# Patient Record
Sex: Male | Born: 1995 | Hispanic: Yes | Marital: Single | Smoking: Current some day smoker
Health system: Southern US, Community
[De-identification: ages and names within clinical notes are randomized; demographics above are authoritative.]

---

## 2015-07-11 ENCOUNTER — Observation Stay (HOSPITAL_COMMUNITY)
Admission: EM | Admit: 2015-07-11 | Discharge: 2015-07-12 | Disposition: A | Discharge status: Home / Self Care | Payer: Self-pay | Attending: Surgery | Admitting: Surgery

## 2015-07-11 ENCOUNTER — Observation Stay (HOSPITAL_COMMUNITY): Payer: Self-pay | Admitting: Anesthesiology

## 2015-07-11 ENCOUNTER — Encounter (HOSPITAL_COMMUNITY): Payer: Self-pay

## 2015-07-11 ENCOUNTER — Encounter (HOSPITAL_COMMUNITY)
Admission: EM | Disposition: A | Discharge status: Home / Self Care | Payer: Self-pay | Source: Home / Self Care | Attending: Emergency Medicine

## 2015-07-11 ENCOUNTER — Emergency Department (HOSPITAL_COMMUNITY): Payer: Self-pay

## 2015-07-11 DIAGNOSIS — F17210 Nicotine dependence, cigarettes, uncomplicated: Secondary | ICD-10-CM | POA: Insufficient documentation

## 2015-07-11 DIAGNOSIS — K3580 Unspecified acute appendicitis: Principal | ICD-10-CM | POA: Diagnosis present

## 2015-07-11 DIAGNOSIS — K353 Acute appendicitis with localized peritonitis, without perforation or gangrene: Secondary | ICD-10-CM | POA: Diagnosis present

## 2015-07-11 HISTORY — PX: LAPAROSCOPIC APPENDECTOMY: SHX408

## 2015-07-11 LAB — CBC
HCT: 44.9 % (ref 39.0–52.0)
Hemoglobin: 15.4 g/dL (ref 13.0–17.0)
MCH: 28.9 pg (ref 26.0–34.0)
MCHC: 34.3 g/dL (ref 30.0–36.0)
MCV: 84.4 fL (ref 78.0–100.0)
Platelets: 218 10*3/uL (ref 150–400)
RBC: 5.32 MIL/uL (ref 4.22–5.81)
RDW: 12.3 % (ref 11.5–15.5)
WBC: 8.3 10*3/uL (ref 4.0–10.5)

## 2015-07-11 LAB — URINE MICROSCOPIC-ADD ON
Bacteria, UA: NONE SEEN
Squamous Epithelial / LPF: NONE SEEN
WBC, UA: NONE SEEN WBC/hpf (ref 0–5)

## 2015-07-11 LAB — DIFFERENTIAL
BASOS ABS: 0 10*3/uL (ref 0.0–0.1)
BASOS PCT: 0 %
EOS ABS: 0.1 10*3/uL (ref 0.0–0.7)
EOS PCT: 1 %
LYMPHS ABS: 1.4 10*3/uL (ref 0.7–4.0)
Lymphocytes Relative: 18 %
MONOS PCT: 5 %
Monocytes Absolute: 0.4 10*3/uL (ref 0.1–1.0)
Neutro Abs: 5.9 10*3/uL (ref 1.7–7.7)
Neutrophils Relative %: 75 %

## 2015-07-11 LAB — COMPREHENSIVE METABOLIC PANEL
ALBUMIN: 4.5 g/dL (ref 3.5–5.0)
ALK PHOS: 57 U/L (ref 38–126)
ALT: 56 U/L (ref 17–63)
AST: 31 U/L (ref 15–41)
Anion gap: 7 (ref 5–15)
BILIRUBIN TOTAL: 0.8 mg/dL (ref 0.3–1.2)
BUN: 19 mg/dL (ref 6–20)
CALCIUM: 9 mg/dL (ref 8.9–10.3)
CO2: 27 mmol/L (ref 22–32)
Chloride: 103 mmol/L (ref 101–111)
Creatinine, Ser: 0.89 mg/dL (ref 0.61–1.24)
GFR calc Af Amer: >60 mL/min (ref >60)
GFR calc non Af Amer: >60 mL/min (ref >60)
GLUCOSE: 111 mg/dL — AB (ref 65–99)
Potassium: 3.8 mmol/L (ref 3.5–5.1)
Sodium: 137 mmol/L (ref 135–145)
TOTAL PROTEIN: 7.3 g/dL (ref 6.5–8.1)

## 2015-07-11 LAB — URINALYSIS, ROUTINE W REFLEX MICROSCOPIC
BILIRUBIN URINE: NEGATIVE
Glucose, UA: NEGATIVE mg/dL
HGB URINE DIPSTICK: NEGATIVE
KETONES UR: NEGATIVE mg/dL
Leukocytes, UA: NEGATIVE
NITRITE: NEGATIVE
PH: 7.5 (ref 5.0–8.0)
Specific Gravity, Urine: 1.015 (ref 1.005–1.030)

## 2015-07-11 LAB — LIPASE, BLOOD: Lipase: 17 U/L (ref 11–51)

## 2015-07-11 SURGERY — APPENDECTOMY, LAPAROSCOPIC
Anesthesia: General

## 2015-07-11 MED ORDER — DIATRIZOATE MEGLUMINE & SODIUM 66-10 % PO SOLN
ORAL | Status: AC
  Filled 2015-07-11: qty 30

## 2015-07-11 MED ORDER — BUPIVACAINE HCL (PF) 0.5 % IJ SOLN
INTRAMUSCULAR | Status: AC
Start: 2015-07-11 — End: 2015-07-11
  Filled 2015-07-11: qty 30

## 2015-07-11 MED ORDER — NEOSTIGMINE METHYLSULFATE 10 MG/10ML IV SOLN
INTRAVENOUS | Status: AC
  Filled 2015-07-11: qty 1

## 2015-07-11 MED ORDER — METRONIDAZOLE IN NACL 5-0.79 MG/ML-% IV SOLN: 500.0000 mg | INTRAVENOUS | Status: DC

## 2015-07-11 MED ORDER — NEOSTIGMINE METHYLSULFATE 10 MG/10ML IV SOLN
INTRAVENOUS | Status: DC | PRN
  Administered 2015-07-11: 2 mg via INTRAVENOUS
  Administered 2015-07-11: 1 mg via INTRAVENOUS

## 2015-07-11 MED ORDER — SUCCINYLCHOLINE CHLORIDE 20 MG/ML IJ SOLN
INTRAMUSCULAR | Status: AC
  Filled 2015-07-11: qty 1

## 2015-07-11 MED ORDER — CEFTRIAXONE SODIUM 2 G IJ SOLR
2.0000 g | Freq: Once | INTRAMUSCULAR | Status: AC
  Administered 2015-07-12: 2 g via INTRAVENOUS
  Filled 2015-07-11 (×2): qty 2

## 2015-07-11 MED ORDER — METRONIDAZOLE IN NACL 5-0.79 MG/ML-% IV SOLN
500.0000 mg | Freq: Once | INTRAVENOUS | Status: AC
  Administered 2015-07-11: 500 mg via INTRAVENOUS
  Filled 2015-07-11: qty 100

## 2015-07-11 MED ORDER — ONDANSETRON HCL 4 MG/2ML IJ SOLN
4.0000 mg | Freq: Once | INTRAMUSCULAR | Status: AC
  Administered 2015-07-11: 4 mg via INTRAVENOUS
  Filled 2015-07-11: qty 2

## 2015-07-11 MED ORDER — OXYCODONE-ACETAMINOPHEN 5-325 MG PO TABS
1.0000 | ORAL_TABLET | ORAL | Status: DC | PRN
Start: 2015-07-11 — End: 2015-07-12

## 2015-07-11 MED ORDER — FENTANYL CITRATE (PF) 100 MCG/2ML IJ SOLN
INTRAMUSCULAR | Status: DC | PRN
  Administered 2015-07-11: 50 ug via INTRAVENOUS
  Administered 2015-07-11: 100 ug via INTRAVENOUS
  Administered 2015-07-11 (×2): 50 ug via INTRAVENOUS
  Administered 2015-07-11: 100 ug via INTRAVENOUS

## 2015-07-11 MED ORDER — CEFTRIAXONE SODIUM 2 G IJ SOLR
2.0000 g | Freq: Once | INTRAMUSCULAR | Status: DC
  Filled 2015-07-11: qty 2

## 2015-07-11 MED ORDER — CEFTRIAXONE SODIUM 2 G IJ SOLR
2.0000 g | INTRAMUSCULAR | Status: DC
  Filled 2015-07-11: qty 2

## 2015-07-11 MED ORDER — MORPHINE SULFATE (PF) 4 MG/ML IV SOLN
4.0000 mg | Freq: Once | INTRAVENOUS | Status: AC
  Administered 2015-07-11: 4 mg via INTRAVENOUS
  Filled 2015-07-11: qty 1

## 2015-07-11 MED ORDER — METRONIDAZOLE IN NACL 5-0.79 MG/ML-% IV SOLN: 500.0000 mg | Freq: Three times a day (TID) | INTRAVENOUS | Status: DC

## 2015-07-11 MED ORDER — CHLORHEXIDINE GLUCONATE CLOTH 2 % EX PADS: 6.0000 | MEDICATED_PAD | Freq: Once | CUTANEOUS | Status: DC

## 2015-07-11 MED ORDER — ONDANSETRON HCL 4 MG/2ML IJ SOLN
INTRAMUSCULAR | Status: DC | PRN
  Administered 2015-07-11: 4 mg via INTRAVENOUS

## 2015-07-11 MED ORDER — FENTANYL CITRATE (PF) 250 MCG/5ML IJ SOLN
INTRAMUSCULAR | Status: AC
Start: 2015-07-11 — End: 2015-07-11
  Filled 2015-07-11: qty 5

## 2015-07-11 MED ORDER — METRONIDAZOLE IN NACL 5-0.79 MG/ML-% IV SOLN
500.0000 mg | Freq: Three times a day (TID) | INTRAVENOUS | Status: AC
  Administered 2015-07-11 – 2015-07-12 (×2): 500 mg via INTRAVENOUS
  Filled 2015-07-11 (×2): qty 100

## 2015-07-11 MED ORDER — LIDOCAINE HCL (PF) 1 % IJ SOLN
INTRAMUSCULAR | Status: AC
  Filled 2015-07-11: qty 5

## 2015-07-11 MED ORDER — SODIUM CHLORIDE 0.9 % IR SOLN
Status: DC | PRN
  Administered 2015-07-11: 1000 mL

## 2015-07-11 MED ORDER — PROPOFOL 10 MG/ML IV BOLUS
INTRAVENOUS | Status: DC | PRN
  Administered 2015-07-11: 180 mg via INTRAVENOUS

## 2015-07-11 MED ORDER — DEXTROSE 5 % IV SOLN
1.0000 g | Freq: Once | INTRAVENOUS | Status: AC
  Administered 2015-07-11: 1 g via INTRAVENOUS
  Filled 2015-07-11: qty 10

## 2015-07-11 MED ORDER — ONDANSETRON HCL 4 MG/2ML IJ SOLN
INTRAMUSCULAR | Status: AC
  Filled 2015-07-11: qty 2

## 2015-07-11 MED ORDER — MIDAZOLAM HCL 5 MG/5ML IJ SOLN
INTRAMUSCULAR | Status: DC | PRN
  Administered 2015-07-11 (×2): 2 mg via INTRAVENOUS

## 2015-07-11 MED ORDER — FENTANYL CITRATE (PF) 100 MCG/2ML IJ SOLN
INTRAMUSCULAR | Status: AC
  Filled 2015-07-11: qty 2

## 2015-07-11 MED ORDER — SUCCINYLCHOLINE CHLORIDE 20 MG/ML IJ SOLN
INTRAMUSCULAR | Status: DC | PRN
  Administered 2015-07-11: 175 mg via INTRAVENOUS

## 2015-07-11 MED ORDER — ROCURONIUM BROMIDE 50 MG/5ML IV SOLN
INTRAVENOUS | Status: AC
  Filled 2015-07-11: qty 1

## 2015-07-11 MED ORDER — GLYCOPYRROLATE 0.2 MG/ML IJ SOLN
INTRAMUSCULAR | Status: DC | PRN
  Administered 2015-07-11: 0.6 mg via INTRAVENOUS

## 2015-07-11 MED ORDER — METOCLOPRAMIDE HCL 5 MG/ML IJ SOLN
INTRAMUSCULAR | Status: AC
Start: 2015-07-11 — End: 2015-07-11
  Filled 2015-07-11: qty 2

## 2015-07-11 MED ORDER — LIDOCAINE HCL (PF) 1 % IJ SOLN
INTRAMUSCULAR | Status: AC
  Filled 2015-07-11: qty 30

## 2015-07-11 MED ORDER — ROCURONIUM BROMIDE 100 MG/10ML IV SOLN
INTRAVENOUS | Status: DC | PRN
  Administered 2015-07-11: 20 mg via INTRAVENOUS
  Administered 2015-07-11: 35 mg via INTRAVENOUS
  Administered 2015-07-11: 5 mg via INTRAVENOUS

## 2015-07-11 MED ORDER — LIDOCAINE HCL 1 % IJ SOLN
INTRAMUSCULAR | Status: DC | PRN
  Administered 2015-07-11: 35 mg via INTRADERMAL

## 2015-07-11 MED ORDER — METOCLOPRAMIDE HCL 5 MG/ML IJ SOLN
INTRAMUSCULAR | Status: DC | PRN
  Administered 2015-07-11: 10 mg via INTRAVENOUS

## 2015-07-11 MED ORDER — DEXAMETHASONE SODIUM PHOSPHATE 10 MG/ML IJ SOLN
INTRAMUSCULAR | Status: DC | PRN
  Administered 2015-07-11: 10 mg via INTRAVENOUS

## 2015-07-11 MED ORDER — IBUPROFEN 600 MG PO TABS: 600.0000 mg | ORAL_TABLET | Freq: Four times a day (QID) | ORAL | Status: DC | PRN

## 2015-07-11 MED ORDER — PROPOFOL 10 MG/ML IV BOLUS
INTRAVENOUS | Status: AC
  Filled 2015-07-11: qty 20

## 2015-07-11 MED ORDER — LACTATED RINGERS IV SOLN
INTRAVENOUS | Status: DC | PRN
  Administered 2015-07-11 (×2): via INTRAVENOUS

## 2015-07-11 MED ORDER — IOPAMIDOL (ISOVUE-300) INJECTION 61%
100.0000 mL | Freq: Once | INTRAVENOUS | Status: AC | PRN
  Administered 2015-07-11: 100 mL via INTRAVENOUS

## 2015-07-11 MED ORDER — MIDAZOLAM HCL 2 MG/2ML IJ SOLN
INTRAMUSCULAR | Status: AC
  Filled 2015-07-11: qty 4

## 2015-07-11 MED ORDER — DEXAMETHASONE SODIUM PHOSPHATE 4 MG/ML IJ SOLN
INTRAMUSCULAR | Status: AC
  Filled 2015-07-11: qty 1

## 2015-07-11 MED ORDER — GLYCOPYRROLATE 0.2 MG/ML IJ SOLN
INTRAMUSCULAR | Status: AC
  Filled 2015-07-11: qty 3

## 2015-07-11 MED ORDER — LIDOCAINE HCL 1 % IJ SOLN
INTRAMUSCULAR | Status: DC | PRN
  Administered 2015-07-11: 12 mL via INTRAMUSCULAR
  Administered 2015-07-11 (×2): 3 mL via INTRAMUSCULAR

## 2015-07-11 SURGICAL SUPPLY — 40 items
BAG HAMPER (MISCELLANEOUS) ×3 IMPLANT
CHLORAPREP W/TINT 26ML (MISCELLANEOUS) ×3 IMPLANT
CLOTH BEACON ORANGE TIMEOUT ST (SAFETY) ×3 IMPLANT
COVER LIGHT HANDLE STERIS (MISCELLANEOUS) ×6 IMPLANT
CUTTER LINEAR ENDO 35 ART FLEX (STAPLE) ×3 IMPLANT
DECANTER SPIKE VIAL GLASS SM (MISCELLANEOUS) ×3 IMPLANT
DERMABOND ADVANCED (GAUZE/BANDAGES/DRESSINGS) ×2
DERMABOND ADVANCED .7 DNX12 (GAUZE/BANDAGES/DRESSINGS) ×1 IMPLANT
DEVICE TROCAR PUNCTURE CLOSURE (ENDOMECHANICALS) ×3 IMPLANT
ELECT REM PT RETURN 9FT ADLT (ELECTROSURGICAL) ×3
ELECTRODE REM PT RTRN 9FT ADLT (ELECTROSURGICAL) ×1 IMPLANT
EVACUATOR SMOKE 8.L (FILTER) ×3 IMPLANT
FORMALIN 10 PREFIL 120ML (MISCELLANEOUS) ×3 IMPLANT
GLOVE BIOGEL PI IND STRL 7.0 (GLOVE) ×3 IMPLANT
GLOVE BIOGEL PI IND STRL 7.5 (GLOVE) ×1 IMPLANT
GLOVE BIOGEL PI INDICATOR 7.0 (GLOVE) ×6
GLOVE BIOGEL PI INDICATOR 7.5 (GLOVE) ×2
GLOVE ECLIPSE 6.5 STRL STRAW (GLOVE) ×3 IMPLANT
GLOVE ECLIPSE 7.0 STRL STRAW (GLOVE) ×3 IMPLANT
GOWN STRL REUS W/ TWL XL LVL3 (GOWN DISPOSABLE) ×1 IMPLANT
GOWN STRL REUS W/TWL LRG LVL3 (GOWN DISPOSABLE) ×3 IMPLANT
GOWN STRL REUS W/TWL XL LVL3 (GOWN DISPOSABLE) ×2
INST SET LAPROSCOPIC AP (KITS) ×3 IMPLANT
KIT ROOM TURNOVER APOR (KITS) ×3 IMPLANT
MANIFOLD NEPTUNE II (INSTRUMENTS) ×3 IMPLANT
NEEDLE INSUFFLATION 14GA 120MM (NEEDLE) ×3 IMPLANT
NS IRRIG 1000ML POUR BTL (IV SOLUTION) ×3 IMPLANT
PACK LAP CHOLE LZT030E (CUSTOM PROCEDURE TRAY) ×3 IMPLANT
PAD ARMBOARD 7.5X6 YLW CONV (MISCELLANEOUS) ×3 IMPLANT
POUCH SPECIMEN RETRIEVAL 10MM (ENDOMECHANICALS) ×3 IMPLANT
SET BASIN LINEN APH (SET/KITS/TRAYS/PACK) ×3 IMPLANT
SHEARS HARMONIC ACE PLUS 36CM (ENDOMECHANICALS) ×3 IMPLANT
SUT VIC AB 4-0 PS2 27 (SUTURE) ×3 IMPLANT
SUT VICRYL 0 UR6 27IN ABS (SUTURE) ×3 IMPLANT
TRAY FOLEY CATH SILVER 16FR (SET/KITS/TRAYS/PACK) ×3 IMPLANT
TROCAR ENDO BLADELESS 11MM (ENDOMECHANICALS) ×3 IMPLANT
TROCAR ENDO BLADELESS 12MM (ENDOMECHANICALS) ×3 IMPLANT
TROCAR XCEL NON-BLD 5MMX100MML (ENDOMECHANICALS) ×3 IMPLANT
TUBING INSUFFLATION (TUBING) ×3 IMPLANT
WARMER LAPAROSCOPE (MISCELLANEOUS) ×3 IMPLANT

## 2015-07-11 NOTE — Anesthesia Procedure Notes (Signed)
Procedure Name: Intubation Date/Time: 07/11/2015 4:43 PM Performed by: Despina HiddenIDACAVAGE, Atwood Adcock J Pre-anesthesia Checklist: Patient identified, Emergency Drugs available, Suction available and Patient being monitored Patient Re-evaluated:Patient Re-evaluated prior to inductionOxygen Delivery Method: Circle system utilized Preoxygenation: Pre-oxygenation with 100% oxygen Intubation Type: IV induction, Rapid sequence and Cricoid Pressure applied Ventilation: Mask ventilation without difficulty Laryngoscope Size: Mac and 3 Grade View: Grade I Tube size: 8.0 mm Number of attempts: 1 Airway Equipment and Method: Stylet and Oral airway Placement Confirmation: ETT inserted through vocal cords under direct vision and positive ETCO2 Secured at: 22 cm Tube secured with: Tape Dental Injury: Teeth and Oropharynx as per pre-operative assessment

## 2015-07-11 NOTE — ED Notes (Signed)
Per surgeon, pt is to remain in ED until time of surgery at approximately 1600 and then pt will be transferred to inpatient room.

## 2015-07-11 NOTE — ED Provider Notes (Signed)
CSN: [id number]     Arrival date & time 07/11/15  7846 History   First MD Initiated Contact with Patient 07/11/15 [phone]     Chief Complaint  Patient presents with  . Abdominal Pain     (Consider location/radiation/quality/duration/timing/severity/associated sxs/prior Treatment) HPI   Marc Todd is a 20 y.o. male who presents to the Emergency Department complaining of Sudden onset of upper abdominal pain that began at 6:30 AM. He describes the pain as sharp, burning and constant. It is also associated with right lower quadrant pain as well.  He reports eating spicy chicken wings last night prior to going to bed. He denies pain similar to this in the past. He also denies heavy alcohol use, fever, dysuria, CP, shortness of breath, vomiting or diarrhea. Nothing makes the pain better or worse. No current medications or previous abdominal surgeries.  Last meal was 10:30 last night   History reviewed. No pertinent past medical history. History reviewed. No pertinent past surgical history. History reviewed. No pertinent family history. Social History  Substance Use Topics  . Smoking status: Current Some Day Smoker    Types: Cigarettes  . Smokeless tobacco: None  . Alcohol Use: Yes     Comment: occasional    Review of Systems  Constitutional: Negative for fever, chills and appetite change.  HENT: Negative for sore throat.   Respiratory: Negative for shortness of breath.   Cardiovascular: Negative for chest pain.  Gastrointestinal: Positive for abdominal pain. Negative for nausea, vomiting, blood in stool and abdominal distention.  Genitourinary: Negative for dysuria, flank pain, decreased urine volume and difficulty urinating.  Musculoskeletal: Negative for back pain.  Skin: Negative for color change and rash.  Neurological: Negative for dizziness, weakness and numbness.  Hematological: Negative for adenopathy.  All other systems reviewed and are negative.     Allergies  Review  of patient's allergies indicates not on file.  Home Medications   Prior to Admission medications   Not on File   BP 147/92 mmHg  Pulse 78  Temp(Src) 97.8 F (36.6 C) (Oral)  Resp 16  Ht  (1.88 m)  Wt 108.863 kg  BMI 30.80 kg/m2  SpO2 100% Physical Exam  Constitutional: He is oriented to person, place, and time. He appears well-developed and well-nourished. No distress.  HENT:  Head: Normocephalic and atraumatic.  Mouth/Throat: Oropharynx is clear and moist.  Neck: Normal range of motion. Neck supple.  Cardiovascular: Normal rate, regular rhythm, normal heart sounds and intact distal pulses.   No murmur heard. Pulmonary/Chest: Effort normal and breath sounds normal. No respiratory distress.  Abdominal: Soft. Normal appearance and bowel sounds are normal. He exhibits no distension and no mass. There is tenderness in the right lower quadrant and epigastric area. There is no rigidity, no rebound, no guarding and no CVA tenderness.  Tenderness to the epigastric and right lower quadrant, no guarding or rebound tenderness.  Musculoskeletal: Normal range of motion. He exhibits no edema.  Neurological: He is alert and oriented to person, place, and time. He exhibits normal muscle tone. Coordination normal.  Skin: Skin is warm and dry.  Psychiatric: He has a normal mood and affect.  Nursing note and vitals reviewed.   ED Course  Procedures (including critical care time) Labs Review Labs Reviewed  COMPREHENSIVE METABOLIC PANEL - Abnormal; Notable for the following:    Glucose, Bld 111 (*)    All other components within normal limits  URINALYSIS, ROUTINE W REFLEX MICROSCOPIC (NOT AT [facility]) - Abnormal;  Notable for the following:    Protein, ur TRACE (*)    All other components within normal limits  LIPASE, BLOOD  CBC  DIFFERENTIAL  URINE MICROSCOPIC-ADD ON    Imaging Review Ct Abdomen Pelvis W Contrast  07/11/2015  CLINICAL DATA:  Awoke with constant upper abdominal pain,  points epigastric region, RIGHT lower quadrant pain, describes it as burning, no vomiting or diarrhea, smoker, initial encounter EXAM: CT ABDOMEN AND PELVIS WITH CONTRAST TECHNIQUE: Multidetector CT imaging of the abdomen and pelvis was performed using the standard protocol following bolus administration of intravenous contrast. Sagittal and coronal MPR images reconstructed from axial data set. CONTRAST:  100mL ISOVUE-300 IOPAMIDOL (ISOVUE-300) INJECTION 61% IV. Dilute oral contrast. COMPARISON:  None FINDINGS: Lower chest:  Lung bases clear Hepatobiliary: Liver and gallbladder normal appearance Pancreas: Normal appearance Spleen: Normal appearance.  Splenule at splenic hilum. Adrenals/Urinary Tract: Adrenal glands normal appearance. Kidneys normal appearance without mass or hydronephrosis. Normal appearing ureters in urinary bladder. Stomach/Bowel: Terminal ileum and cecum normal appearance. Enlarged thickened appendix with minimal periappendiceal inflammatory changes consistent with acute appendicitis. Stomach and remaining bowel loops normal appearance. Vascular/Lymphatic: Vascular structures patent.  No adenopathy. Reproductive: N/A Other: No free air or free fluid.  No hernia or mass. Musculoskeletal: Few scattered Schmorl's nodes and irregular vertebral endplates. No acute bony findings. IMPRESSION: Enlarged thickened appendix with minimal periappendiceal inflammatory changes compatible with acute appendicitis. Electronically Signed   By: Ulyses SouthwardMark  Boles M.D.   On: 07/11/2015 11:25   I have personally reviewed and evaluated these images and lab results as part of my medical decision-making.   EKG Interpretation None      MDM   Final diagnoses:  Acute appendicitis without peritonitis   1145  Patient resting comfortably, pain has improved after medications.  Has remained NPO.  Discussed CT findings with the patient.  Labs wnml, afebrile.  Will consult general surgery  1150  Consulted Dr. Earlene Plateravis, will  see pt in ED, recommends Rocephin and Flagyl   Pauline Ausammy Tarena Gockley, PA-C 07/11/15 1204  Loren Raceravid Yelverton, MD 07/11/15 [phone]

## 2015-07-11 NOTE — ED Notes (Signed)
Pt given CHG wipes to cleanse body with.

## 2015-07-11 NOTE — Transfer of Care (Signed)
Immediate Anesthesia Transfer of Care Note  Patient: Marc Todd  Procedure(s) Performed: Procedure(s): LAPAROSCOPIC APPENDECTOMY (N/A)  Patient Location: PACU  Anesthesia Type:General  Level of Consciousness: awake and patient cooperative  Airway & Oxygen Therapy: Patient Spontanous Breathing and Patient connected to face mask oxygen  Post-op Assessment: Report given to RN, Post -op Vital signs reviewed and stable and Patient moving all extremities  Post vital signs: Reviewed and stable  Last Vitals:  Filed Vitals:   07/11/15 1545 07/11/15 1600  BP: 117/73 118/61  Pulse: 70   Temp: 36.7 C   Resp: 24 19    Last Pain:  Filed Vitals:   07/11/15 1609  PainSc: 8          Complications: No apparent anesthesia complications

## 2015-07-11 NOTE — H&P (Signed)
SURGICAL ADMISSION HISTORY & PHYSICAL  HISTORY OF PRESENT ILLNESS (HPI):  20 y.o. male, otherwise reportedly healthy, presented with acute onset of epigastric abdominal pain that woke him from sleep this morning and then progressed to become greatest at the Right lower quadrant. Patient denies any nausea, vomiting, change in bowel habits, fever/chills, CP, or SOB and reports his pain is now currently well-controlled.   PAST MEDICAL HISTORY (PMH):  History reviewed. No pertinent past medical history.   PAST SURGICAL HISTORY (PSH):  History reviewed. No pertinent past surgical history.   MEDICATIONS:  Prior to Admission medications   Not on File    ALLERGIES:  Not on File   SOCIAL HISTORY:  Social History   Social History  . Marital Status: Single    Spouse Name: N/A  . Number of Children: N/A  . Years of Education: N/A   Occupational History  . Not on file.   Social History Main Topics  . Smoking status: Current Some Day Smoker    Types: Cigarettes  . Smokeless tobacco: Not on file  . Alcohol Use: Yes     Comment: occasional  . Drug Use: No  . Sexual Activity: Not on file   Other Topics Concern  . Not on file   Social History Narrative  . No narrative on file    The patient currently resides (home / rehab facility / nursing home): Home  The patient normally is (ambulatory / bedbound): Ambulatory   FAMILY HISTORY:  History reviewed. No pertinent family history.   REVIEW OF SYSTEMS:  Constitutional: denies weight loss, fever, chills, or sweats  Eyes: denies any other vision changes, history of eye injury  ENT: denies sore throat, hearing problems  Respiratory: denies shortness of breath, wheezing  Cardiovascular: denies chest pain, palpitations  Gastrointestinal: abdominal pain as per HPI, denies N/V or diarrhea  Musculoskeletal: denies  any other joint pains or cramps  Skin: denies any other rashes or skin discolorations  Neurological: denies any other headache, dizziness, weakness  Psychiatric: denies any other depression, anxiety   All other review of systems were negative   VITAL SIGNS:  Temp: [97.8 F (36.6 C)-98 F (36.7 C)] 98 F (36.7 C) (06/21 1122) Pulse Rate: [66-78] 66 (06/21 1200) Resp: [16] 16 (06/21 1122) BP: (121-147)/(70-92) 134/86 mmHg (06/21 1200) SpO2: [97 %-100 %] 99 % (06/21 1200) Weight: [108.863 kg (240 lb)] 108.863 kg (240 lb) (06/21 0851)     Height:  (188 cm) Weight: 108.863 kg (240 lb) BMI (Calculated): 30.9   INTAKE/OUTPUT:  This shift:    Last 2 shifts: @   PHYSICAL EXAM:  Constitutional:  -- Normal body habitus (mildly overweight)  -- Awake, alert, and oriented x3  Eyes:  -- Pupils equally round and reactive to light  -- No scleral icterus  Ear, nose, and throat:  -- No jugular venous distension  Pulmonary:  -- No crackles  -- Equal breath sounds bilaterally  Cardiovascular:  -- S1, S2 present  -- No pericardial rubs Abdomen:  -- Soft, mild RLQ tenderness to palpation, nondistended, no guarding/rebound  -- No abdominal masses appreciated, pulsatile or otherwise  Musculoskeletal / Integumentary:  -- Wounds or skin discoloration: None  -- Extremities: B/L UE and LE FROM, hands and feet warm, no edema  Neurologic:  -- Motor function: intact and symmetric -- Sensation: intact and symmetric  Labs:  CBC:   Labs (Brief)    Lab Results  Component Value Date   WBC 8.3 07/11/2015  RBC 5.32 07/11/2015     BMP:   Labs (Brief)    Lab Results  Component Value Date   GLUCOSE 111* 07/11/2015   CO2 27 07/11/2015   BUN 19 07/11/2015   CREATININE 0.89 07/11/2015   CALCIUM 9.0 07/11/2015       Imaging studies:  CT abdomen and pelvis (07/11/2015) Terminal ileum and cecum normal  appearance. Enlarged thickened appendix with minimal periappendiceal inflammatory changes consistent with acute appendicitis. Stomach and remaining bowel loops normal appearance.  Assessment/Plan:  20 y.o. male with acute appendicitis.  - NPO, IV fluids - IV Rocephin and Flagyl  - IV pain control as needed  - All risks, benefits, and alternatives to appendectomy were discussed with the patient, who elects to proceed, and informed consent was obtained and documented accordingly - DVT prophylaxis  All of the above findings and recommendations were discussed with the patient and his family, and all of his and family's questions were answered to their expressed satisfaction.  Thank you for the opportunity to participate in the care for this patient.   -- Scherrie GerlachJason E. Earlene Plateravis, MD [place]: [facility] Office: [phone]

## 2015-07-11 NOTE — ED Notes (Signed)
Pt reports he woke up with constant upper abdominal pain, pt points to epigastric area. Describes as burning. No vomiting/ diarrhea

## 2015-07-11 NOTE — Anesthesia Preprocedure Evaluation (Addendum)
Anesthesia Evaluation  Patient identified by MRN, date of birth, ID band Patient awake    Reviewed: Allergy & Precautions, NPO status , Patient's Chart, lab work & pertinent test results  History of Anesthesia Complications Negative for: history of anesthetic complications  Airway Mallampati: II  TM Distance: >3 FB Neck ROM: Full    Dental  (+) Teeth Intact, Dental Advisory Given   Pulmonary Current Smoker,    breath sounds clear to auscultation       Cardiovascular Exercise Tolerance: Good  Rhythm:Regular Rate:Normal     Neuro/Psych    GI/Hepatic Patient received Oral Contrast Agents,  Endo/Other    Renal/GU      Musculoskeletal   Abdominal (+)  Abdomen: soft. Bowel sounds: normal.  Peds  Hematology   Anesthesia Other Findings   Reproductive/Obstetrics                           Anesthesia Physical Anesthesia Plan  ASA: I and emergent  Anesthesia Plan: General   Post-op Pain Management:    Induction: Intravenous, Cricoid pressure planned and Rapid sequence  Airway Management Planned: Oral ETT  Additional Equipment:   Intra-op Plan:   Post-operative Plan: Extubation in OR  Informed Consent: I have reviewed the patients History and Physical, chart, labs and discussed the procedure including the risks, benefits and alternatives for the proposed anesthesia with the patient or authorized representative who has indicated his/her understanding and acceptance.   Dental advisory given  Plan Discussed with: CRNA and Anesthesiologist  Anesthesia Plan Comments:         Anesthesia Quick Evaluation

## 2015-07-11 NOTE — Consult Note (Signed)
SURGICAL CONSULTATION NOTE (initial)  HISTORY OF PRESENT ILLNESS (HPI):  20 y.o. male, otherwise reportedly healthy, presented with acute onset of epigastric abdominal pain that woke him from sleep this morning and then progressed to become greatest at the Right lower quadrant. Patient denies any nausea, vomiting, change in bowel habits, fever/chills, CP, or SOB and reports his pain is now currently well-controlled.   PAST MEDICAL HISTORY (PMH):  History reviewed. No pertinent past medical history.   PAST SURGICAL HISTORY (PSH):  History reviewed. No pertinent past surgical history.   MEDICATIONS:  Prior to Admission medications   Not on File     ALLERGIES:  Not on File   SOCIAL HISTORY:  Social History   Social History  . Marital Status: Single    Spouse Name: N/A  . Number of Children: N/A  . Years of Education: N/A   Occupational History  . Not on file.   Social History Main Topics  . Smoking status: Current Some Day Smoker    Types: Cigarettes  . Smokeless tobacco: Not on file  . Alcohol Use: Yes     Comment: occasional  . Drug Use: No  . Sexual Activity: Not on file   Other Topics Concern  . Not on file   Social History Narrative  . No narrative on file    The patient currently resides (home / rehab facility / nursing home): Home  The patient normally is (ambulatory / bedbound): Ambulatory   FAMILY HISTORY:  History reviewed. No pertinent family history.   REVIEW OF SYSTEMS:  Constitutional: denies weight loss, fever, chills, or sweats  Eyes: denies any other vision changes, history of eye injury  ENT: denies sore throat, hearing problems  Respiratory: denies shortness of breath, wheezing  Cardiovascular: denies chest pain, palpitations  Gastrointestinal: abdominal pain as per HPI, denies N/V or diarrhea  Musculoskeletal: denies any other joint pains or cramps  Skin: denies any other rashes or skin discolorations  Neurological: denies any other  headache, dizziness, weakness  Psychiatric: denies any other depression, anxiety   All other review of systems were negative   VITAL SIGNS:  Temp:  [97.8 F (36.6 C)-98 F (36.7 C)] 98 F (36.7 C) (06/21 1122) Pulse Rate:  [66-78] 66 (06/21 1200) Resp:  [16] 16 (06/21 1122) BP: (121-147)/(70-92) 134/86 mmHg (06/21 1200) SpO2:  [97 %-100 %] 99 % (06/21 1200) Weight:  [108.863 kg (240 lb)] 108.863 kg (240 lb) (06/21 0851)     Height: 6\' 2"  (188 cm) Weight: 108.863 kg (240 lb) BMI (Calculated): 30.9   INTAKE/OUTPUT:  This shift:    Last 2 shifts: @IOLAST2SHIFTS @   PHYSICAL EXAM:  Constitutional:  -- Normal body habitus (mildly overweight)  -- Awake, alert, and oriented x3  Eyes:  -- Pupils equally round and reactive to light  -- No scleral icterus  Ear, nose, and throat:  -- No jugular venous distension  Pulmonary:  -- No crackles  -- Equal breath sounds bilaterally  Cardiovascular:  -- S1, S2 present  -- No pericardial rubs Abdomen:  -- Soft, mild RLQ tenderness to palpation, nondistended, no guarding/rebound  -- No abdominal masses appreciated, pulsatile or otherwise  Musculoskeletal / Integumentary:  -- Wounds or skin discoloration: None  -- Extremities: B/L UE and LE FROM, hands and feet warm, no edema  Neurologic:  -- Motor function: intact and symmetric -- Sensation: intact and symmetric  Labs:  CBC:  Lab Results  Component Value Date   WBC 8.3 07/11/2015  RBC 5.32 Jul 16, 2015   BMP:  Lab Results  Component Value Date   GLUCOSE 111* 07/16/2015   CO2 27 2015/07/16   BUN 19 07/16/15   CREATININE 0.89 07-16-2015   CALCIUM 9.0 07/16/15     Imaging studies:  CT abdomen and pelvis (Jul 16, 2015) Terminal ileum and cecum normal appearance. Enlarged thickened appendix with minimal periappendiceal inflammatory changes consistent with acute appendicitis. Stomach and remaining bowel loops normal appearance.  Assessment/Plan:  20 y.o. male with acute  appendicitis.   - NPO, IV fluids  - IV Rocephin and Flagyl   - IV pain control as needed   - All risks, benefits, and alternatives to appendectomy were discussed with the patient, who elects to proceed, and informed consent was obtained and documented accordingly  - DVT prophylaxis  All of the above findings and recommendations were discussed with the patient and his family, and all of his and family's questions were answered to their expressed satisfaction.  Thank you for the opportunity to participate in the care for this patient.   -- Scherrie Gerlach Earlene Plater, MD [place]: [facility] Office: [phone]

## 2015-07-11 NOTE — Anesthesia Postprocedure Evaluation (Signed)
Anesthesia Post Note  Patient: Marc Todd  Procedure(s) Performed: Procedure(s) (LRB): LAPAROSCOPIC APPENDECTOMY (N/A)  Patient location during evaluation: PACU Anesthesia Type: General Level of consciousness: awake and alert, oriented and patient cooperative Pain management: pain level controlled Vital Signs Assessment: post-procedure vital signs reviewed and stable Respiratory status: spontaneous breathing, nonlabored ventilation and respiratory function stable Cardiovascular status: blood pressure returned to baseline and stable Postop Assessment: no signs of nausea or vomiting Anesthetic complications: no    Last Vitals:  Filed Vitals:   07/11/15 1815 07/11/15 1830  BP: 132/64   Pulse:  65  Temp: 37 C   Resp: 17 13    Last Pain:  Filed Vitals:   07/11/15 1831  PainSc: 4                  Bedelia Pong J

## 2015-07-11 NOTE — Op Note (Signed)
SURGICAL OPERATIVE REPORT  ATTENDING Surgeon(s): Ancil LinseyJason Evan Davis, MD  ASSISTANT(S): None   ANESTHESIA: general   PRE-OPERATIVE DIAGNOSIS: Acute non-perforated appendicitis   POST-OPERATIVE DIAGNOSIS: Acute non-perforated appendicitis   PROCEDURE(S):  1.) Laparoscopic appendectomy   INTRAOPERATIVE FINDINGS: Acute appendicitis   INTRAVENOUS FLUIDS: 900 mL crystalloid   ESTIMATED BLOOD LOSS: Minimal (<20 mL)  URINE OUTPUT: 600 mL (foley removed at completion of surgery)  SPECIMENS: Appendix  IMPLANTS: None  DRAINS: none  COMPLICATIONS: None apparent  CONDITION AT END OF PROCEDURE: Hemodynamically stable and extubated  DISPOSITION OF PATIENT: PACU  INDICATIONS FOR PROCEDURE:  20 y.o. male, otherwise reportedly healthy, presented with acute onset of epigastric abdominal pain that woke him from sleep this morning and then progressed to become greatest at the Right lower quadrant. Patient denied any nausea, vomiting, change in bowel habits, fever/chills, CP, or SOB and reported his pain was well-controlled in the Emergency Department. All risks, benefits, and alternatives to above procedure were discussed with the patient, all of patient's questions were answered to his expressed satisfaction, and informed consent was obtained and documented.  DETAILS OF PROCEDURE: Patient was brought to the operating suite and appropriately identified. General anesthesia was administered along with confirmation of appropriate pre-operative antibiotics, and endotracheal intubation was performed by anesthetist, along with NG/OG tube for gastric decompression. In supine position, operative site was prepped and draped in usual sterile fashion, and following a brief time out, initial 5 mm incision was made in a natural skin crease just above the umbilicus. Fascia was then elevated, and a Verress needle was inserted and its proper position confirmed using saline meniscus test.  Upon insufflation of  the abdominal cavity with carbon dioxide to a well-tolerated pressure of 12-15 mmHg, 5 mm peri-umbilical port followed by laparoscope were inserted and used to inspect the abdominal cavity and its contents with no injuries from insertion of the first trochar noted. Two additional trocars were inserted, one at the Left lower quadrant position (12 mm) and another 5 mm port at the suprapubic position. The table was then placed in Trendelenburg position with the Right side up, and blunt graspers were gently used to retract the bowel overlying a clearly inflamed appendix surrounded by murky ascites and mild-/moderate- inflammation. The appendix was gently retracted by near its tip, and the base of the appendix and mesoappendix were identified in relation to the underlying cecum. The mesoappendix was dissected from the visceral appendix and hemostasis achieved using a harmonic scalpel. Upon freeing the visceral appendix from the mesoappendix, an endostapler loaded with a 35 mm tissue load was advanced across the base of the visceral appendix, which was compressed for several seconds, and the stapler was deployed and removed. Hemostasis was confirmed, and the specimen was extracted from the abdominal cavity in a laparoscopic specimen bag.  The intraperitoneal cavity was inspected with no additional findings. Endoclose laparoscopic fascial closure device was then used to re-approximate fascia at the 12 mm Left lower quadrant port site. All ports were then removed under direct visualization, and the abdominal cavity was desuflated. All port sites were irrigated/cleaned, additional local anesthetic was injected at each incision, 3-0 Vicryl was used to re-approximate dermis at the 12 mm port site, and subcuticular 4-0 Vicryl suture was used to re-approximate skin. Skin was then cleaned, dried, and sterile skin glue was applied. Patient was then safely able to be awakened, extubated, and transferred to PACU for post-operative  monitoring and care.   I was present for all aspects of  procedure, and there were no intra-operative complications apparent.

## 2015-07-12 ENCOUNTER — Encounter (HOSPITAL_COMMUNITY): Payer: Self-pay | Admitting: Surgery

## 2015-07-12 LAB — CBC
HCT: 43 % (ref 39.0–52.0)
Hemoglobin: 14.8 g/dL (ref 13.0–17.0)
MCH: 28.9 pg (ref 26.0–34.0)
MCHC: 34.4 g/dL (ref 30.0–36.0)
MCV: 84 fL (ref 78.0–100.0)
PLATELETS: 245 10*3/uL (ref 150–400)
RBC: 5.12 MIL/uL (ref 4.22–5.81)
RDW: 12.3 % (ref 11.5–15.5)
WBC: 7.3 10*3/uL (ref 4.0–10.5)

## 2015-07-12 MED ORDER — OXYCODONE-ACETAMINOPHEN 5-325 MG PO TABS: 1.0000 | ORAL_TABLET | ORAL | Status: AC | PRN

## 2015-07-12 NOTE — Care Management Note (Signed)
Case Management Note  Patient Details  Name: Marc GanjaRogelio Knauff MRN: [id number] Date of Birth: [birth year]  Subjective/Objective:                  Pt admitted with appendicitis. Pt is ind with ADL's. Pt has no insurance and no PCP. [facility] for [facility] and surgeon f/u. Pt given list of agencies that will provide PCP care for the uninsured. Pt states he will follow up at a later date if needed. Pt will also f/u with surgeon. Pt plans to return home with self care.   Action/Plan: Anticipate DC home today with self care.   Expected Discharge Date:  07/12/15               Expected Discharge Plan:  Home/Self Care  In-House Referral:  NA  Discharge planning Services  CM Consult  Post Acute Care Choice:  NA Choice offered to:  NA  DME Arranged:    DME Agency:     HH Arranged:    HH Agency:     Status of Service:  Completed, signed off  If discussed at [employer] Length of Stay Meetings, dates discussed:    Additional Comments:  Malcolm MetroChildress, Diar Berkel Demske, RN 07/12/2015, 2:40 PM

## 2015-07-12 NOTE — Progress Notes (Signed)
Instructions given on medications,and follow up visits,patient verbalized understanding. Prescription sent with patient.No c/o pain or discomfort noted. Staff to accompany patient to awaiting vehicle.

## 2015-07-12 NOTE — Progress Notes (Signed)
SURGICAL PROGRESS NOTE   Hospital Day(s): Marland Kitchen.   Post op day(s): 1 Day Post-Op.   Interval History: Patient seen and examined, no acute events or new complaints overnight. Patient reports minimal pain well-controlled, denies nausea, fever/chills, CP, or SOB, has been ambulating and tolerating diet.  Review of Systems:  Constitutional: denies fever, chills  HEENT: denies cough or congestion  Respiratory: denies any shortness of breath  Cardiovascular: denies chest pain or palpitations  Gastrointestinal: denies N/V, diarrhea or constipation  Musculoskeletal: denies pain, decreased motor or sensation  Neurological: denies HA or vision/hearing changes   Vital signs in last 24 hours: [min-max] current  Temp:  [97.8 F (36.6 C)-98.9 F (37.2 C)] 97.8 F (36.6 C) (06/22 0700) Pulse Rate:  [64-77] 70 (06/22 0700) Resp:  [13-24] 16 (06/22 0700) BP: (112-132)/(50-75) 113/50 mmHg (06/22 0700) SpO2:  [94 %-100 %] 98 % (06/22 0700)     Height: 6\' 2"  (188 cm) Weight: 108.863 kg (240 lb) BMI (Calculated): 30.9   Intake/Output this shift:      Intake/Output last 2 shifts:  @IOLAST2SHIFTS @   Physical Exam:  Constitutional: alert, cooperative and no distress  HENT: normocephalic without obvious abnormality  Eyes: PERRL, EOM's grossly intact and symmetric  Neuro: CN II - XII grossly intact and symmetric without deficit  Respiratory: breathing non-labored at rest  Cardiovascular: regular rate and sinus rhythm  Gastrointestinal: soft, non-tender, and non-distended, incisions well-approximated without erythema or drainage, minimal tenderness to palpation Musculoskeletal: UE and LE FROM, no edema or wounds, motor and sensation grossly intact, NT   Labs:  CBC:  Lab Results  Component Value Date   WBC 7.3 07/12/2015   RBC 5.12 07/12/2015     Imaging studies: No new pertinent imaging studies    Assessment/Plan:  20 y.o. male doing well POD # 1 Day Post-Op s/p laparoscopic appendectomy  for acute non-perforated appendicitis.   - pain control prn   - discharge planning   - ambulation encouraged   - follow-up in 2 weeks   All of the above findings and recommendations were discussed with the patient and patient's family, and all of patient's and family's questions were answered to their expressed satisfaction.  -- Scherrie GerlachJason E. Earlene Plateravis, MD [place]: [facility] Office: [phone]

## 2015-07-12 NOTE — Anesthesia Postprocedure Evaluation (Signed)
Anesthesia Post Note  Patient: Marc Todd  Procedure(s) Performed: Procedure(s) (LRB): LAPAROSCOPIC APPENDECTOMY (N/A)  Patient location during evaluation: Nursing Unit Anesthesia Type: General Level of consciousness: awake and alert, oriented and patient cooperative Pain management: pain level controlled Vital Signs Assessment: post-procedure vital signs reviewed and stable Respiratory status: spontaneous breathing, nonlabored ventilation and respiratory function stable Cardiovascular status: blood pressure returned to baseline Postop Assessment: no signs of nausea or vomiting and adequate PO intake Anesthetic complications: no    Last Vitals:  Filed Vitals:   07/12/15 0300 07/12/15 0700  BP: 120/66 113/50  Pulse: 77 70  Temp: 36.7 C 36.6 C  Resp: 16 16    Last Pain:  Filed Vitals:   07/12/15 0727  PainSc: 3                  Katie Moch J

## 2015-07-12 NOTE — Addendum Note (Signed)
Addendum  created 07/12/15 0753 by Despina Hiddenobert J Mireya Meditz, CRNA   Modules edited: Clinical Notes   Clinical Notes:  File: [id number]

## 2015-07-15 NOTE — Discharge Summary (Signed)
Physician Discharge Summary  Patient ID: Marc Todd MRN: [id number] DOB/AGE: Dec 08, 1995 20 y.o.  Admit date: 07/11/2015 Discharge date: 07/15/2015  Admission Diagnoses:  Discharge Diagnoses:  Active Problems:   Appendicitis, acute   Acute appendicitis with localized peritonitis   Discharged Condition: good  Hospital Course: Patient presented with acute onset of generalized abdominal pain, becoming focused in the RLQ. CT imaging confirmed acute appendicitis, and surgery was consulted accordingly. Informed consent was obtained, and patient underwent uneventful laparoscopic appendectomy. Patient was feeling well post-operatively with normalization of leukocytosis, and discharge planning was initiated with appropriate pain control and follow-up.  Consults: general surgery  Significant Diagnostic Studies: labs: CBC and radiology: CT scan: abdomen and pelvis  Treatments: IV hydration, and surgery: laparoscopic appendectomy  Discharge Exam: Blood pressure 113/50, pulse 70, temperature 97.8 F (36.6 C), temperature source Oral, resp. rate 16, height 6\' 2"  (1.88 m), weight 108.863 kg (240 lb), SpO2 98 %. GI: soft, non-tender; bowel sounds normal; no masses,  no organomegaly  Disposition: 01-Home or Self Care     Medication List    TAKE these medications        oxyCODONE-acetaminophen 5-325 MG tablet  Commonly known as:  PERCOCET/ROXICET  Take 1 tablet by mouth every 4 (four) hours as needed for severe pain.           Follow-up Information    Follow up with Ancil LinseyJason Evan Maya Scholer, MD.   Specialty:  General Surgery   Why:  call office to make appointment in 2 weeks   Contact information:   [address] [place] [facility] [id number] [phone]       Signed: Ancil LinseyJason Evan Berthe Oley 07/15/2015, 8:27 AM

## 2016-06-26 IMAGING — CT CT ABD-PELV W/ CM
1 series · 15 of 32 positions shown, 19 images · IV contrast (iopamidol)
Comparison: None

CLINICAL DATA: Awoke with constant upper abdominal pain, points
epigastric region, RIGHT lower quadrant pain, describes it as
burning, no vomiting or diarrhea, smoker, initial encounter

EXAM:
CT ABDOMEN AND PELVIS WITH CONTRAST
TECHNIQUE: Multidetector CT imaging of the abdomen and pelvis was performed
using the standard protocol following bolus administration of
intravenous contrast. Sagittal and coronal MPR images reconstructed
from axial data set.
CONTRAST:  100mL OTH6I1-0NN IOPAMIDOL (OTH6I1-0NN) INJECTION 61% IV.
Dilute oral contrast.

[Series 2: routine abd pel with · axial · 0.79mm/px · z∈[-512,-22]mm · 15 of 110 slices shown, 19 images]
[im 8/110  soft-tissue]
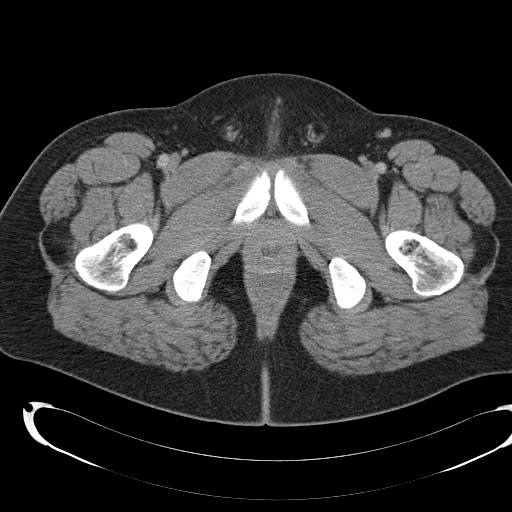
[im 8/110  bone]
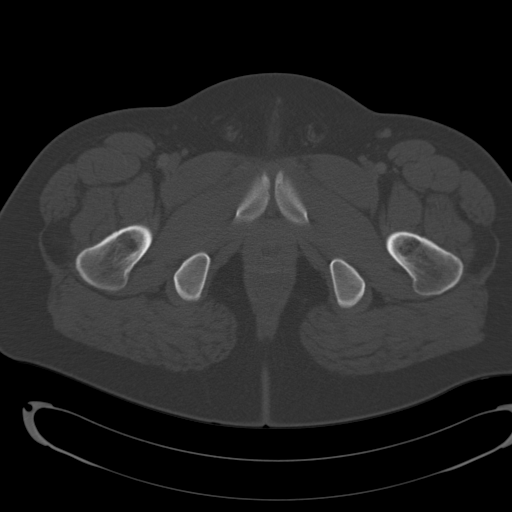
[im 15/110  soft-tissue]
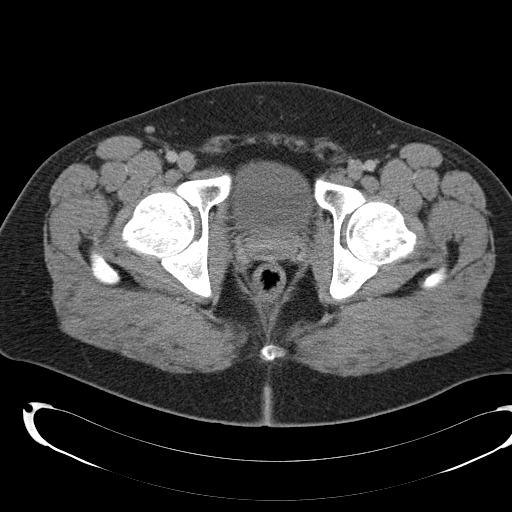
[im 22/110  soft-tissue]
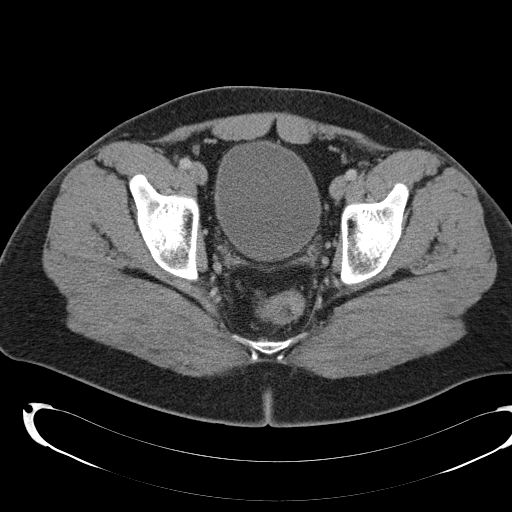
[im 32/110  soft-tissue]
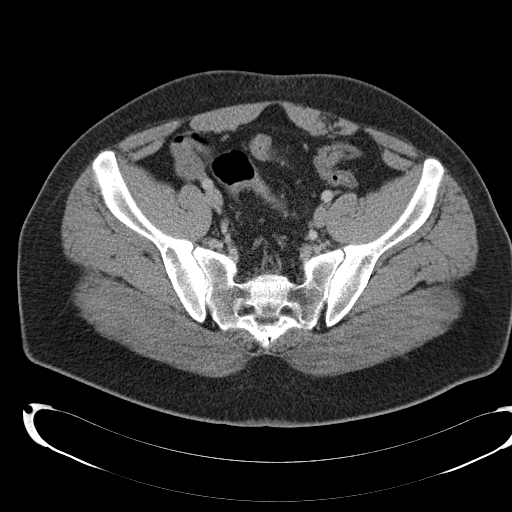
[im 39/110  soft-tissue]
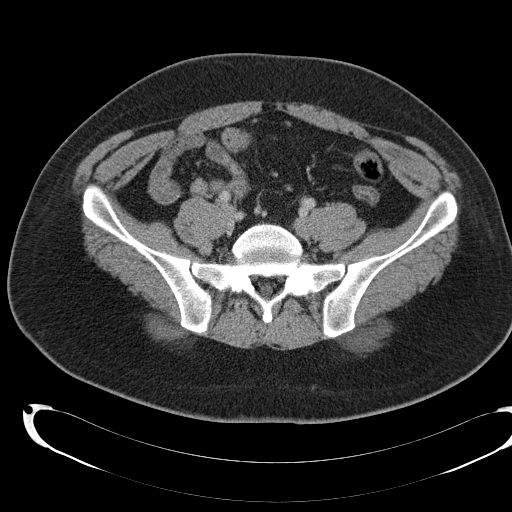
[im 46/110  soft-tissue]
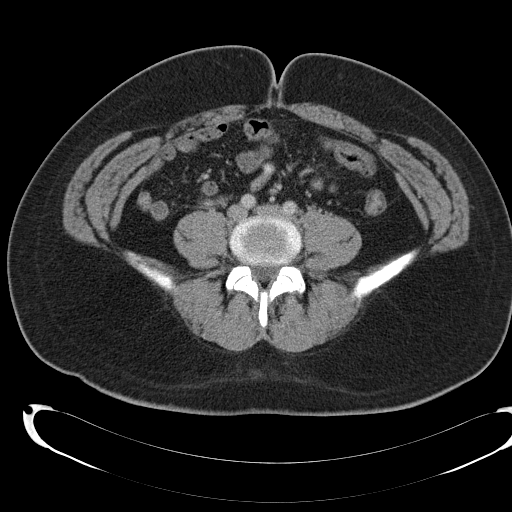
[im 57/110  soft-tissue]
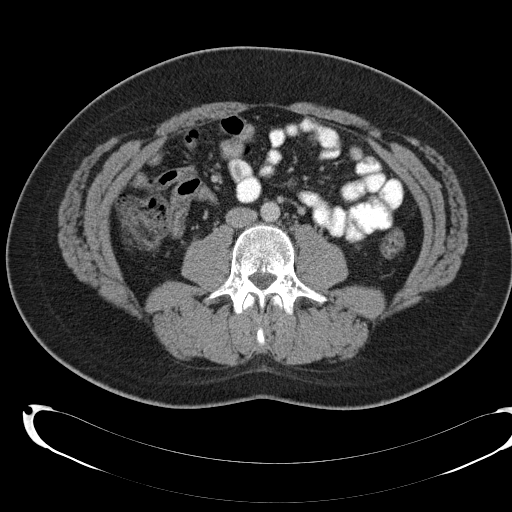
[im 64/110  soft-tissue]
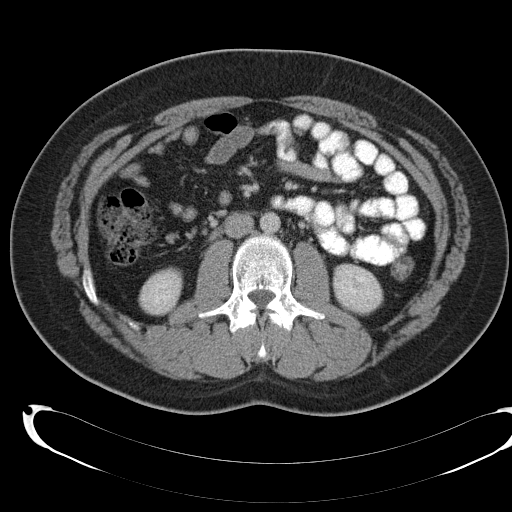
[im 71/110  soft-tissue]
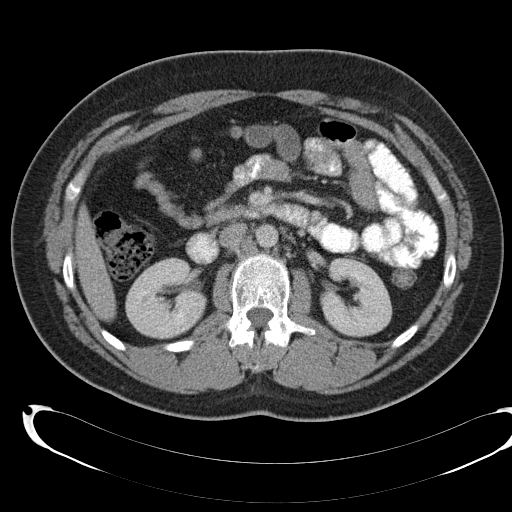
[im 71/110  bone]
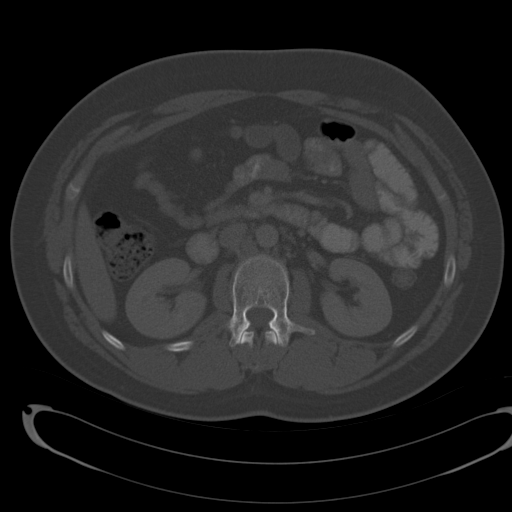
[im 78/110  soft-tissue]
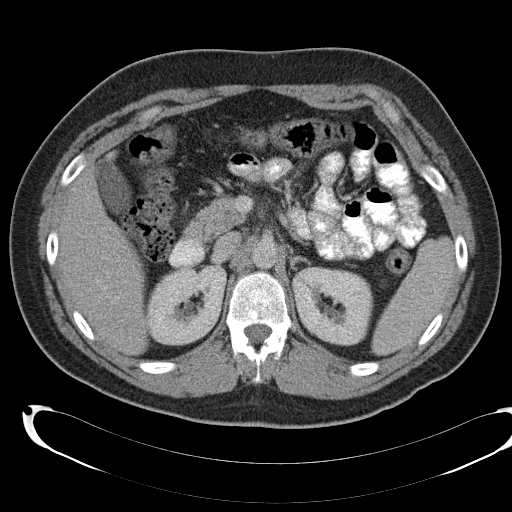
[im 88/110  soft-tissue]
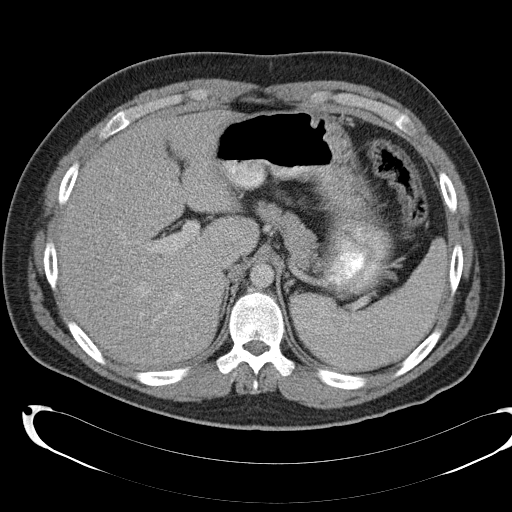
[im 95/110  soft-tissue]
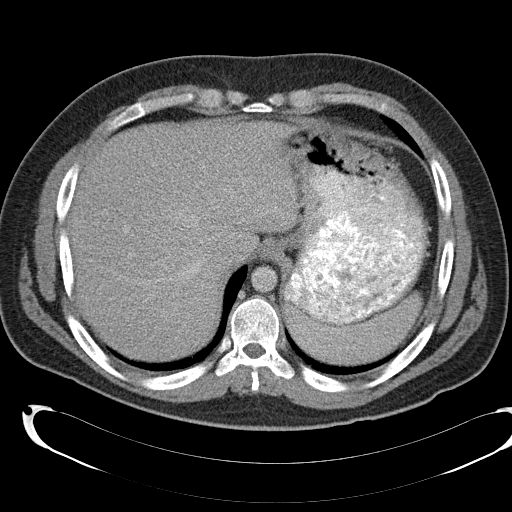
[im 95/110  lung]
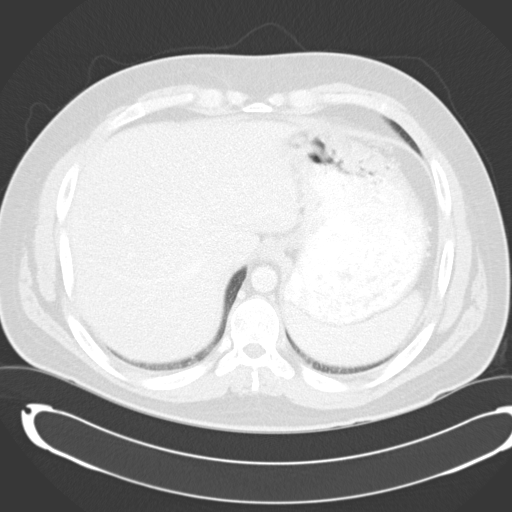
[im 99/110  lung]
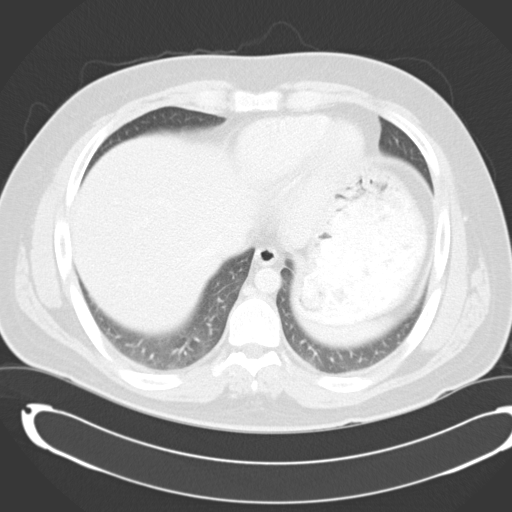
[im 102/110  soft-tissue]
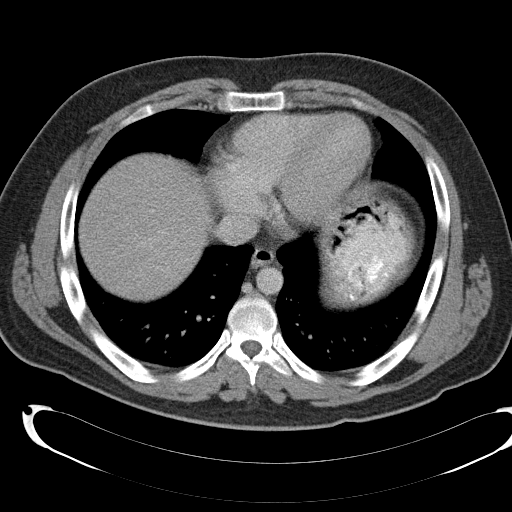
[im 102/110  lung]
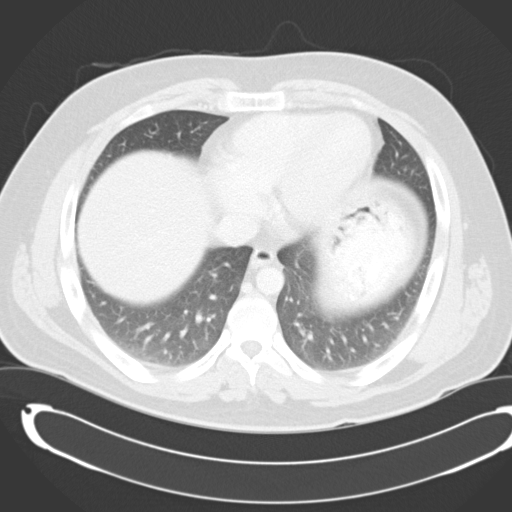
[im 106/110  lung]
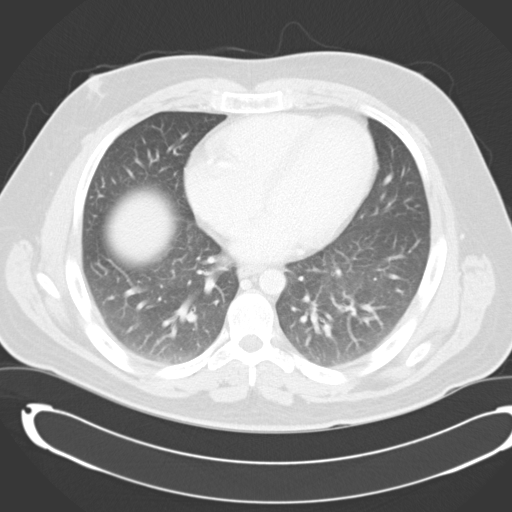

[15 of 32 positions shown; findings below may reference images not displayed]

FINDINGS: Lower chest:  Lung bases clear

Hepatobiliary: Liver and gallbladder normal appearance

Pancreas: Normal appearance

Spleen: Normal appearance.  Splenule at splenic hilum.

Adrenals/Urinary Tract: Adrenal glands normal appearance. Kidneys
normal appearance without mass or hydronephrosis. Normal appearing
ureters in urinary bladder.

Stomach/Bowel: Terminal ileum and cecum normal appearance. Enlarged
thickened appendix with minimal periappendiceal inflammatory changes
consistent with acute appendicitis. Stomach and remaining bowel
loops normal appearance.

Vascular/Lymphatic: Vascular structures patent.  No adenopathy.

Reproductive: N/A

Other: No free air or free fluid.  No hernia or mass.

Musculoskeletal: Few scattered Schmorl's nodes and irregular
vertebral endplates. No acute bony findings.
IMPRESSION: Enlarged thickened appendix with minimal periappendiceal
inflammatory changes compatible with acute appendicitis.
